# Patient Record
Sex: Female | Born: 1970 | Race: White | Hispanic: No | Marital: Married
Health system: Midwestern US, Academic
[De-identification: ages and names within clinical notes are randomized; demographics above are authoritative.]

---

## 2010-08-08 NOTE — Unmapped (Signed)
Formatting of this note might be different from the original.  Message copied by Trish Fountain on Thu Aug 08, 2010  2:04 PM  ------       Message from: Clelia Schaumann       Created: Thu Aug 08, 2010 10:17 AM       Contact: Brenee Gajda was seen by Dr. Court Joy on July 22, 2010.  Since then she has been experiencing pain in her fingers when writing and typing.  She is wondering if there is any kind of brace she can get to help with this.         Please call her at [phone]         Thanks, Andrey Campanile  Electronically signed by Trish Fountain at 08/08/2010  2:04 PM EDT

## 2010-08-08 NOTE — Unmapped (Signed)
Formatting of this note might be different from the original.  Spoke to [name] about her finger issues. She said its her fingers and they are causing her problems with typing/writing, especially the joints towards the top. She also is getting a shooting pain from the elbow down to her hand occasionally. She complains this feels like nerve pain. She is living in [place], so she could not go anywhere we would recommend in [place]. I suggested contacting some hand therapy centers, or OTs to see if they would be able to give her exercises and possibly fit her for finger splints. She will look into this and let me know if she needs a referral.   Electronically signed by Trish Fountain at 08/08/2010  2:14 PM EDT

## 2018-12-15 IMAGING — MR MRI THORACIC SPINE WITHOUT CONTRAST
4 of 7 series · 10 of 48 positions shown · non-contrast
Comparison: None

MRI THORACIC SPINE WITHOUT CONTRAST, 12/15/2018 [DATE]: 
CLINICAL INDICATION: History of nuclear [place] syndrome, lupus and myasthenia 
gravis. Patient temporarily paralyzed due to cervical spine epidural process 
which lasted for two years. Patient has bilateral upper extremity radiculopathy 
and bilateral lower extremity radiculopathy.
TECHNIQUE: Multiplanar, multiecho position MR images of the thoracic spine were 
performed without intravenous contrast enhancement.

[Series 202: mobiview fused cor · coronal · 10.0mm · 0.88mm/px · 3 of 10 slices shown]
[im 1/10]
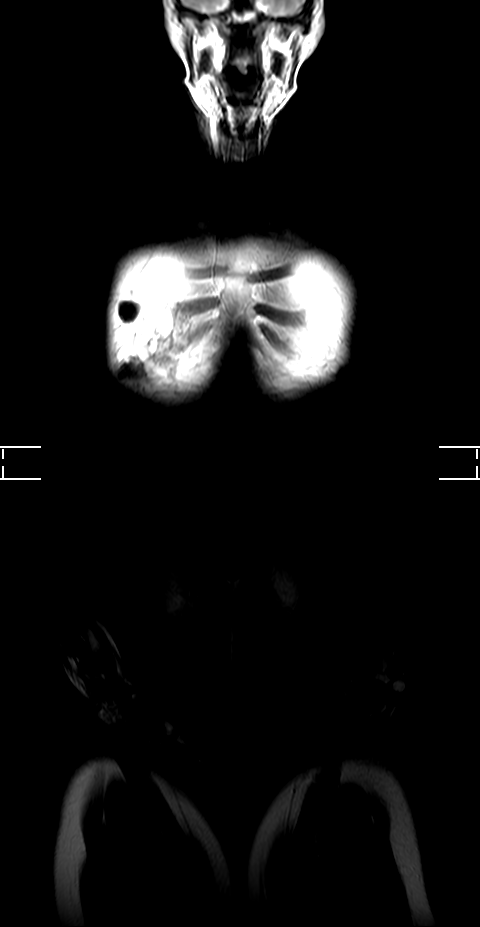
[im 7/10]
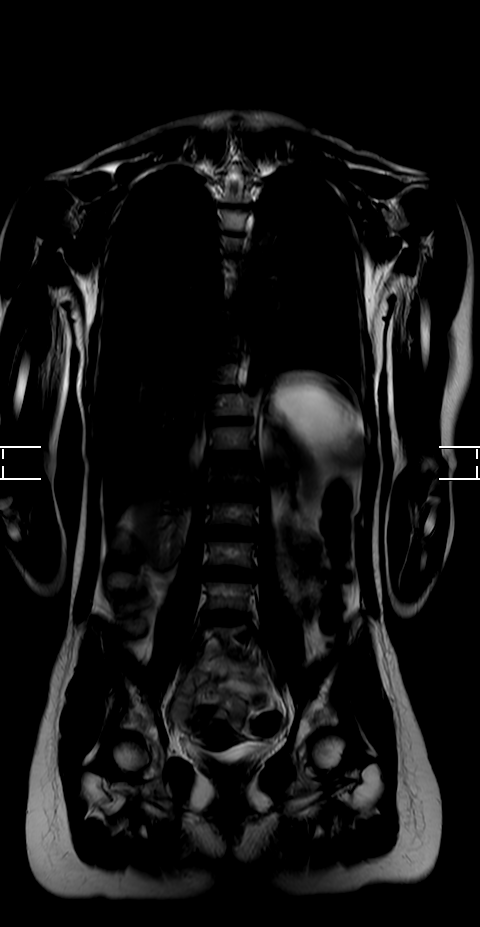
[im 10/10]
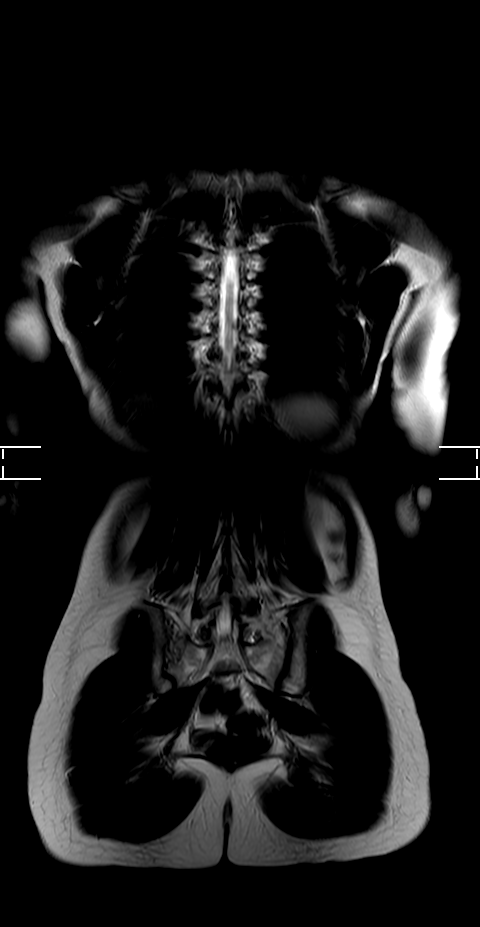

[Series 303: T1 · sagittal · 5.5mm · 0.66mm/px · 3 of 15 slices shown]
[im 1/15]
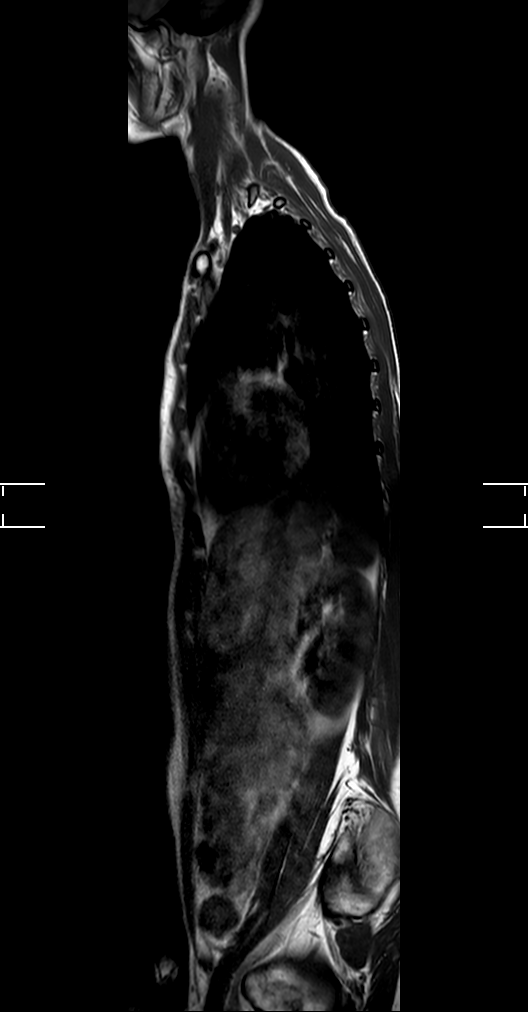
[im 8/15]
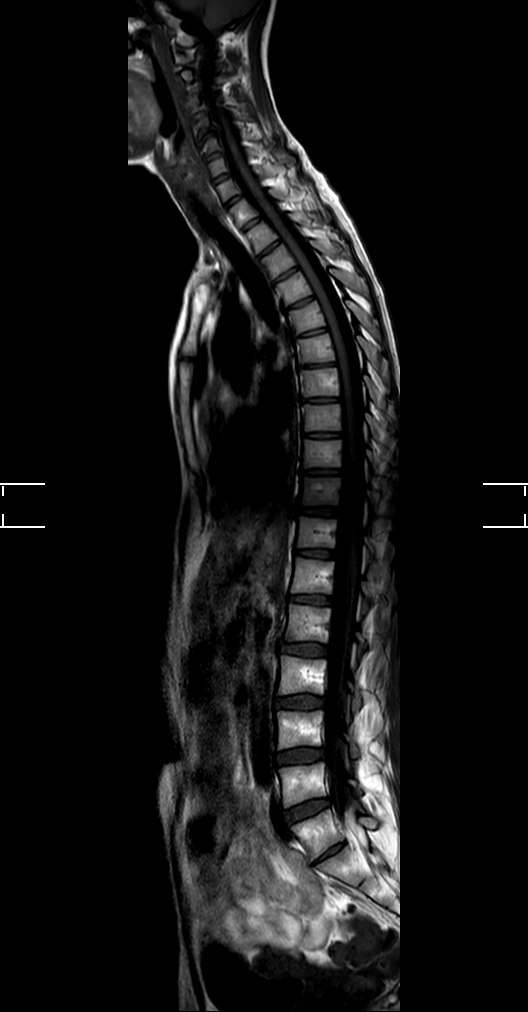
[im 15/15]
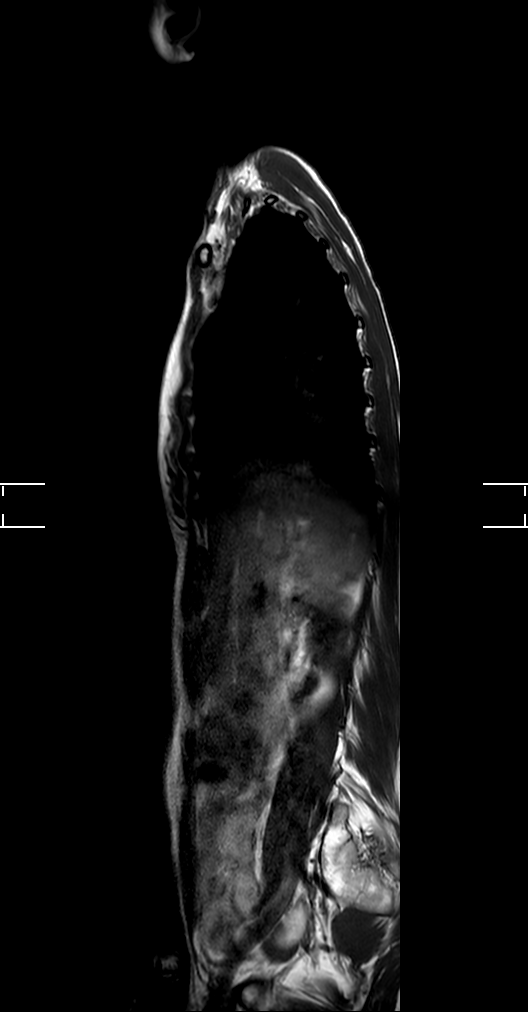

[Series 401: t1_tse_sag · sagittal · 3.0mm · 0.48mm/px · 3 of 21 slices shown]
[im 5/21]
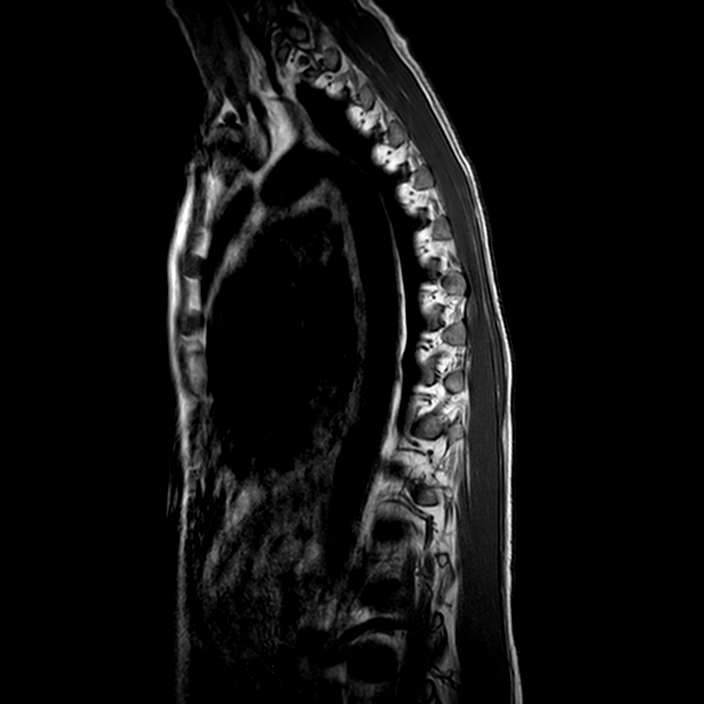
[im 13/21]
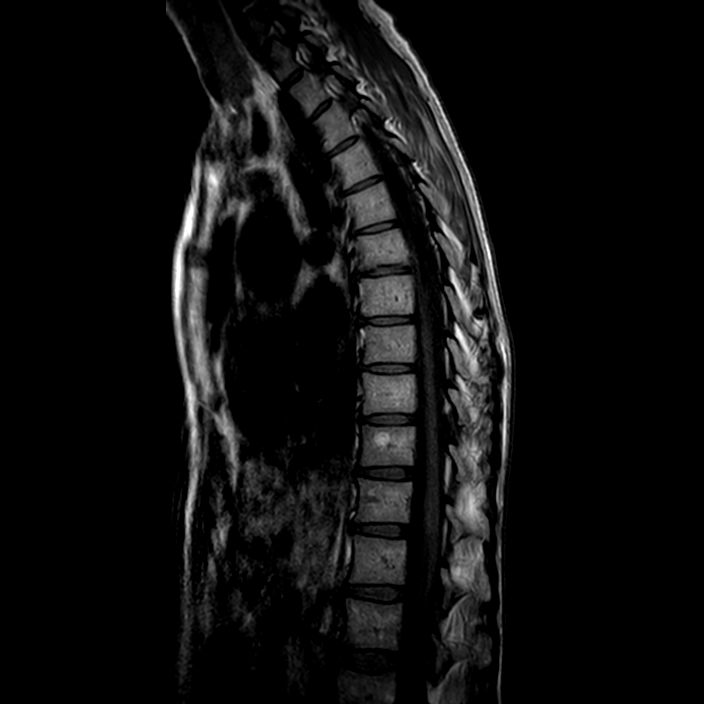
[im 21/21]
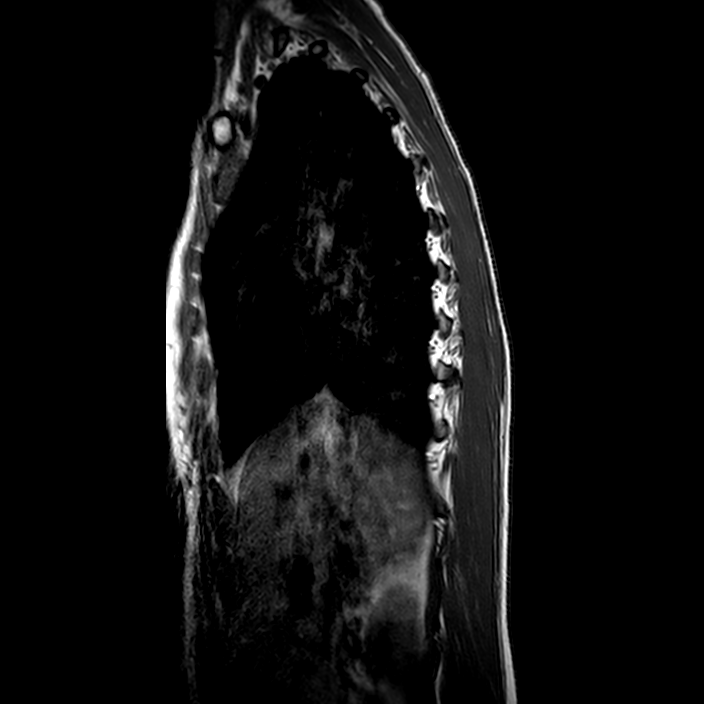

[Series 501: t2_tse_sag · sagittal · 3.0mm · 0.45mm/px · 1 of 21 slices shown]
[im 5/21]
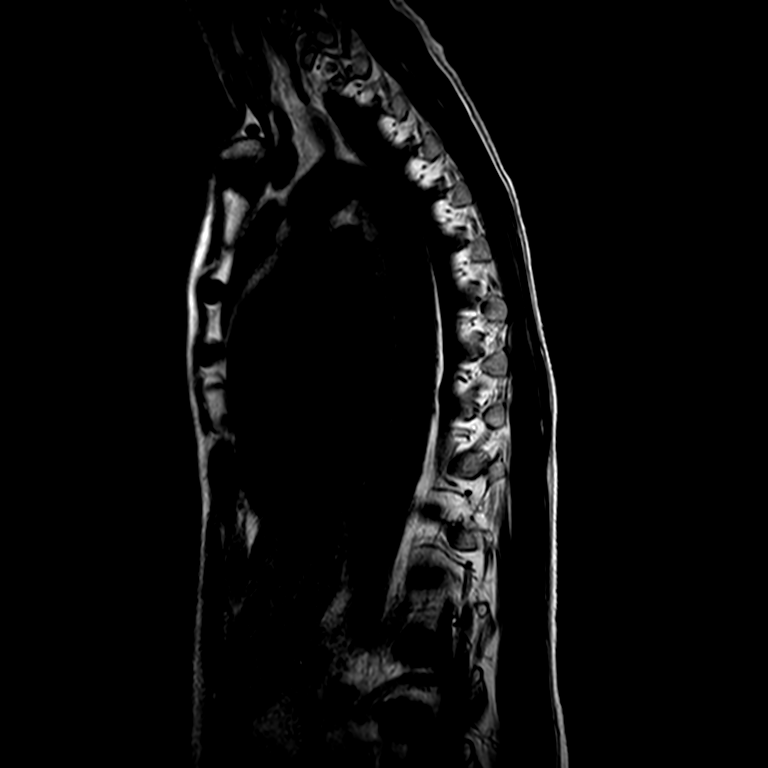

[10 of 48 positions shown; findings below may reference images not displayed]

FINDINGS: There is no fracture or subluxation seen in the thoracic spine. The 
thoracic cord is intact with no intra or extra thecal abnormality seen along the 
spinal canal. No thoracic epidural hematoma or collection is present. 
There is some loss of T2 signal in the upper thoracic spine within this basis 
from T1 through T7 with small Schmorl's nodes seen at the inferior plate of T6. 
There is a small disc right paracentral disc protrusion at the T7-8 level 
causing mild right lateral recess stenosis and density thecal sac on the right, 
but no canal stenosis. This is best seen on sagittal images and not as well seen 
on axial images. 
At T9-10 there are left-sided facet hypertrophic changes indenting the thecal 
sac posteriorly indenting the thecal sac posteriorly and to the left without 
canal stenosis and this abuts the thoracic cord but does not compress the 
thoracic cord or efface surrounding CSF signal. 
No disc protrusions or other canal stenosis or lateral recess stenosis seen 
elsewhere in the thoracic spine. The conus terminates at the L1 level and has 
normal size and signal intensity.
IMPRESSION: At T7-8 there is a small right paracentral disc protrusion causing mild right 
lateral recess stenosis without canal stenosis and indents the thecal sac on the 
right. This is best appreciated on the sagittal images and not as 
well-demonstrated on the axial images due to angulation of axial images at this 
site. 
At T9-10 there are left-sided facet hypertrophic changes indenting the thecal 
sac posteriorly indenting the thecal sac on the left posteriorly but no 
effacement of the thecal sac or canal stenosis. 
No epidural collections or hematomas with normal-appearing thoracic cord with no 
abnormal thoracic cord signal intensity.

## 2018-12-15 IMAGING — MR MRI CERVICAL SPINE WITHOUT CONTRAST
4 of 5 series · 23 of 48 positions shown · IV contrast (gadolinium)
Comparison: None prior of the cervical spine.

MRI CERVICAL SPINE WITHOUT CONTRAST, 12/15/2018 [DATE]: 
CLINICAL INDICATION: History of varicose Rusmel syndrome. Lupus. Myasthenia 
gravis. Patient temporal a paralyzed after C-spine epidural in 3888 which last 
for 2 years. Spine instability. Bilateral upper extremity and lower extremity 
radiculopathy.
TECHNIQUE: Multiplanar, multiecho position MR images of the cervical spine were 
performed without intravenous gadolinium enhancement.

[Series 801: survey* · axial · 10.0mm · 1.56mm/px · z∈[-50,+136]mm · 8 of 15 slices shown]
[im 1/15]
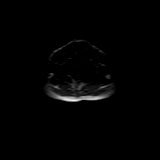
[im 3/15]
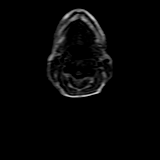
[im 5/15]
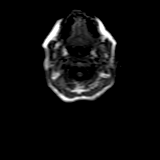
[im 7/15]
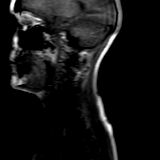
[im 9/15]
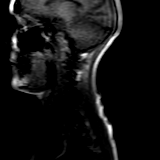
[im 11/15]
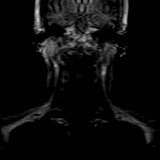
[im 13/15]
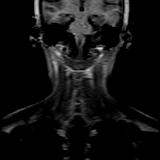
[im 15/15]
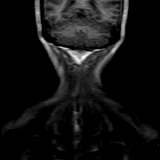

[Series 901: t1_sag · sagittal · 3.0mm · 0.31mm/px · 7 of 15 slices shown]
[im 1/15]
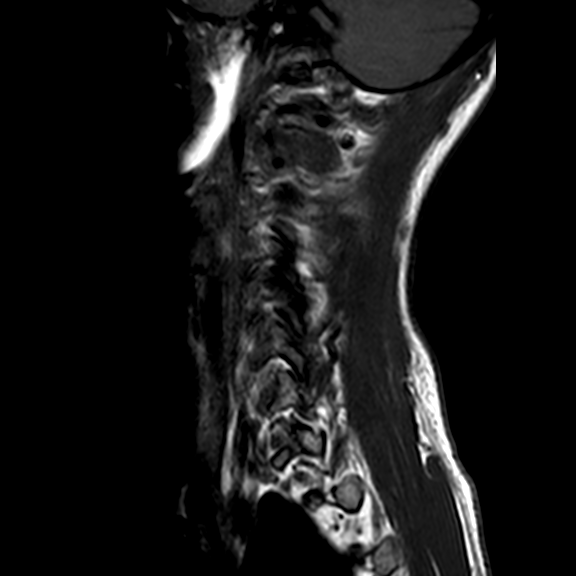
[im 3/15]
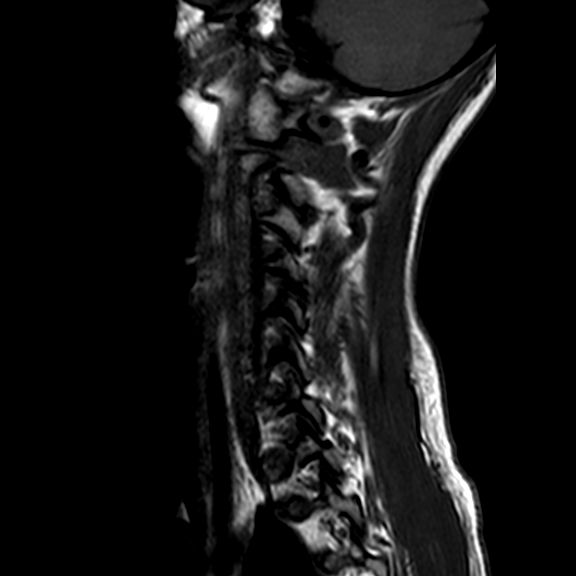
[im 5/15]
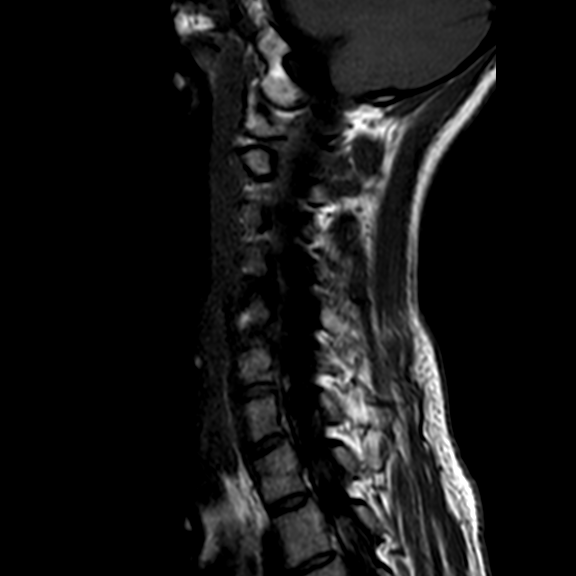
[im 8/15]
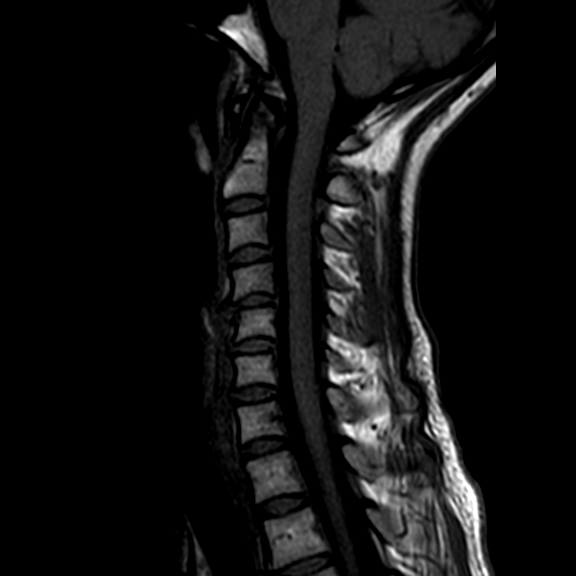
[im 10/15]
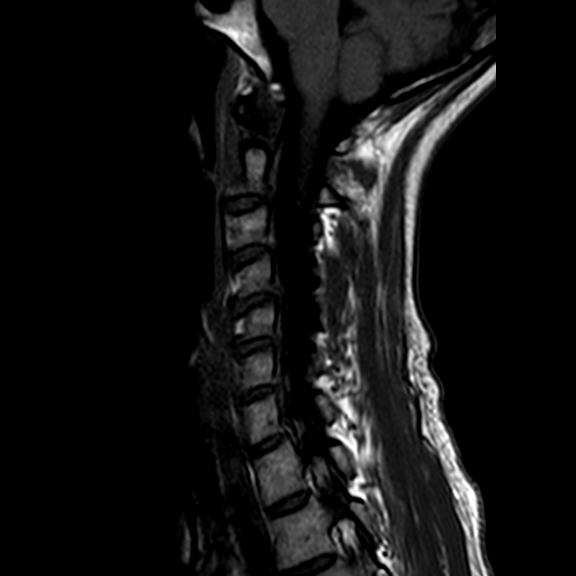
[im 12/15]
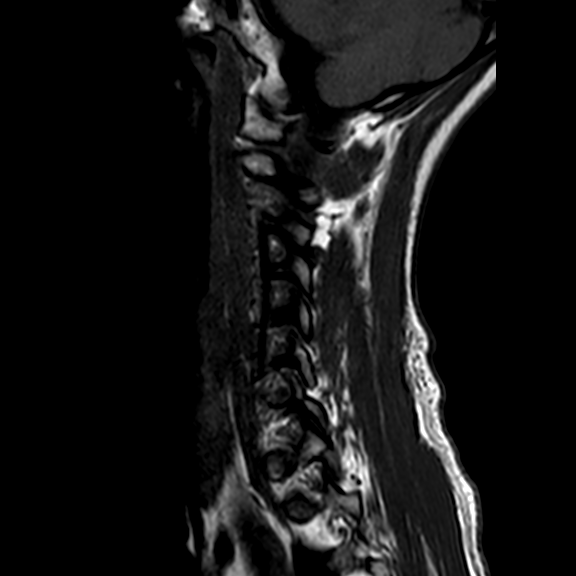
[im 15/15]
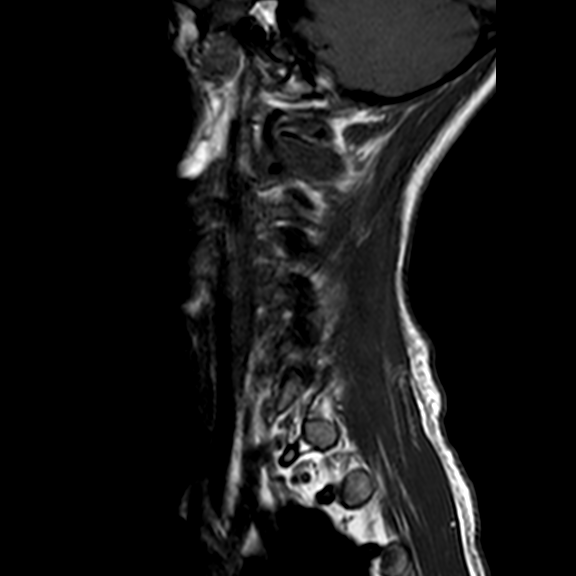

[Series 1001: t2w_mv_xd_sag · sagittal · 3.0mm · 0.31mm/px · 5 of 15 slices shown]
[im 1/15]
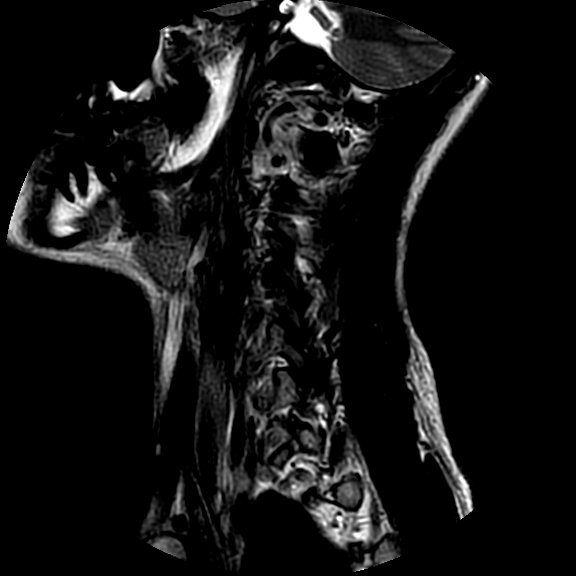
[im 3/15]
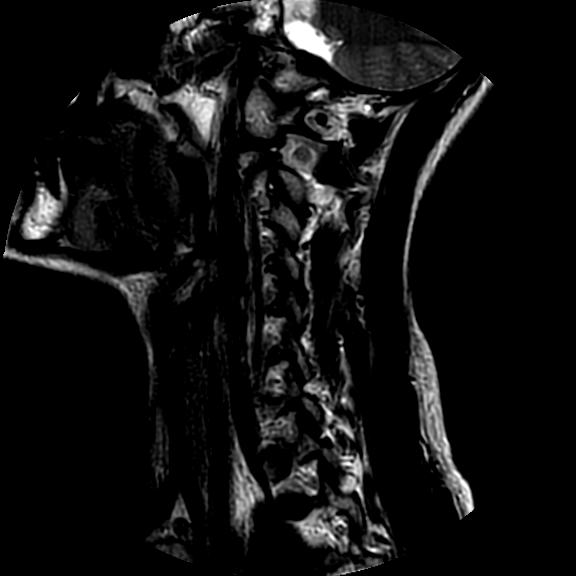
[im 5/15]
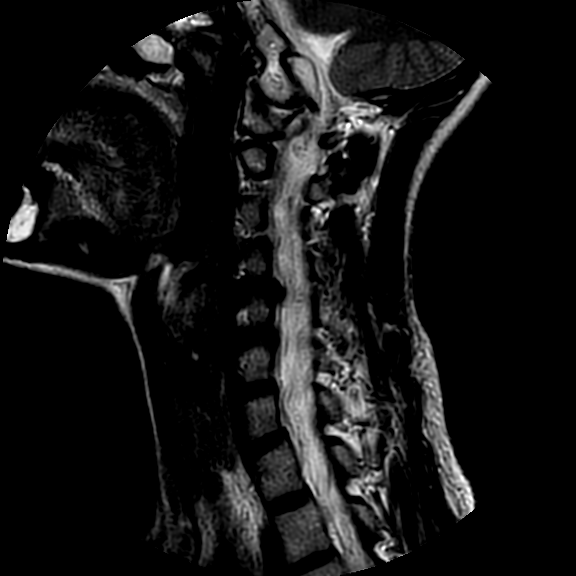
[im 8/15]
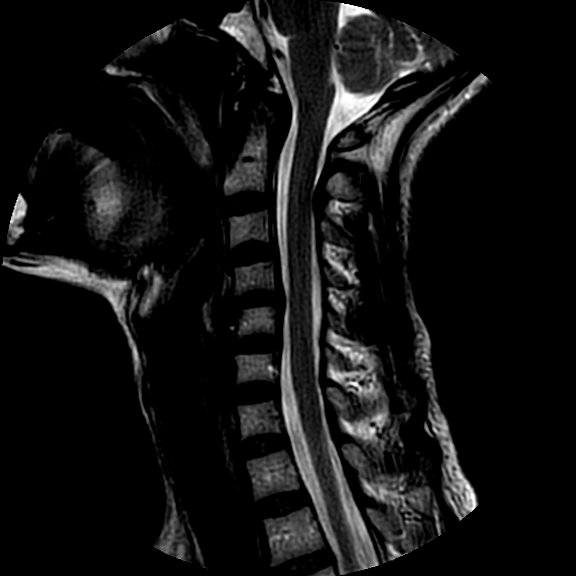
[im 12/15]
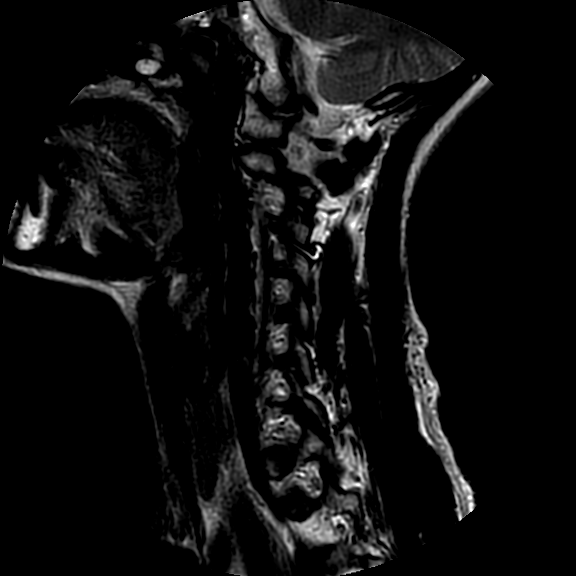

[Series 1101: stir_sag · sagittal · 3.0mm · 0.42mm/px · 3 of 15 slices shown]
[im 3/15]
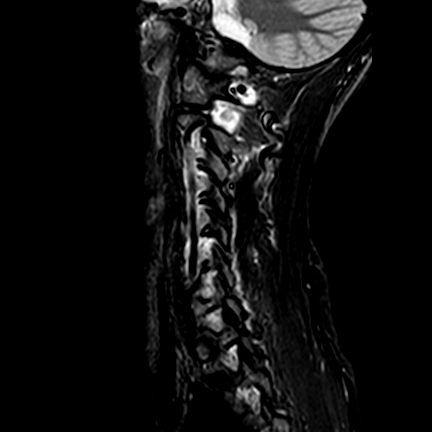
[im 8/15]
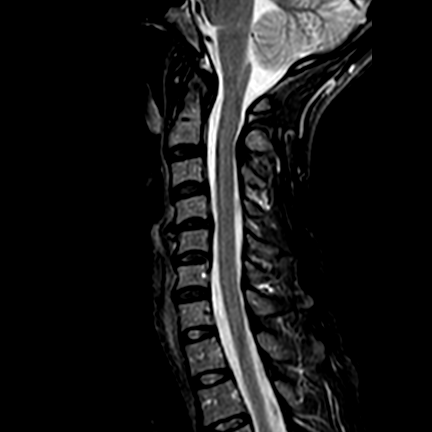
[im 12/15]
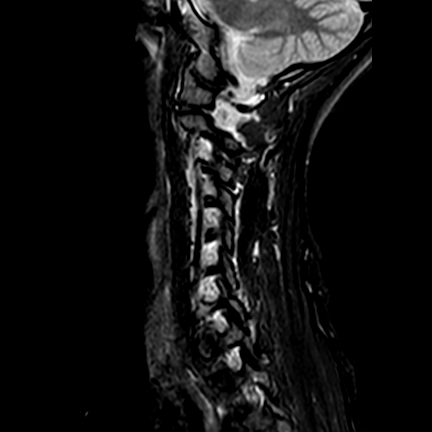

[23 of 48 positions shown; findings below may reference images not displayed]

FINDINGS: The vertebral bodies are normal in height and alignment. No vertebral 
body fracture. No spondylolisthesis. Normal bone marrow signal intensity. No 
signal abnormality or mass within the included portion of the spinal cord or 
spinal canal. The cerebellar tonsils are well located. The foramen magnum is 
negative. Included portions of the intracranial contents are negative. Posterior 
paraspinal soft tissues are negative. 
C2-C3: The disc is normal in height and signal. No disc herniation. Normal 
facets. No spinal canal or neural foraminal stenosis. 
C3-C4: The disc is normal in height and signal. No disc herniation. Normal 
facets. No spinal canal or neural foraminal stenosis. 
C4-C5: Mild disc desiccation and ventral disc height loss. There is ventral 
interbody osteophytic spurring. Mild broad-based disc protrusion with partial 
effacement of ventral thecal sac. No spinal canal or neural foraminal stenosis. 
C5-C6: The disc is normal in height and signal. No disc herniation. Normal 
facets. No spinal canal or neural foraminal stenosis. 
C6-C7: The disc is normal in height and signal. No disc herniation. Normal 
facets. No spinal canal or neural foraminal stenosis. 
C7-T1: The disc is normal in height and signal. No disc herniation. Normal 
facets. No spinal canal or neural foraminal stenosis.
IMPRESSION: 1.  Mild spondylotic changes cervical spine. 
2.  At C4-C5 there is a mild broad-based disc protrusion with partial effacement 
of the ventral thecal sac. 
3.  No spinal canal stenosis, neural foraminal stenosis or discrete neural 
impingement.

## 2020-09-19 NOTE — Unmapped (Signed)
LMTCB TO VERIFY IF PATIENT HAS MRI/CT OR WHAT TESTING DOES THE PATIENT HAS COMPLETED PRIOR TO VISIT.

## 2020-09-21 NOTE — Unmapped (Signed)
Spoke to the patient, she is going to get her pcp to order a MRI Cervical with Flex and Ext and CT and Xray then contact me to get reschedule.

## 2020-09-21 NOTE — Unmapped (Signed)
Pt calling  Pt has had not MRI/CT done in the past year.Pt wants to know if MD can do a video visit for 1st visit. I did tell Pt that MD usually would like to do an inperson visit to evaluate.    Call pt at [phone] (home)

## 2020-09-28 ENCOUNTER — Ambulatory Visit

## 2022-01-29 IMAGING — MR MRI CERVICAL SPINE WITHOUT CONTRAST
7 series · 25 of 48 positions shown · IV contrast (gadolinium)
Comparison: MRI cervical spine from December 15, 2018.

________________________________________________________________________________________________ 
MRI CERVICAL SPINE WITHOUT CONTRAST, 01/29/2022 [DATE]: 
CLINICAL INDICATION: Two-week history of neck pain that radiates to bilateral 
upper extremities.
TECHNIQUE: Multiplanar, multiecho position MR images of the cervical spine were 
performed without intravenous gadolinium enhancement. Patient was scanned on a 
3T magnet.

[Series 101: survey · axial · 10.0mm · 0.94mm/px · 1 of 9 slices shown]
[im 1/9]
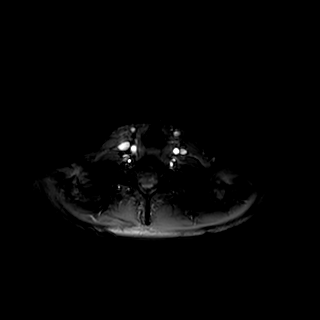

[Series 201: t2w_tse cor · coronal · 5.0mm · 0.52mm/px · 2 of 7 slices shown]
[im 1/7]
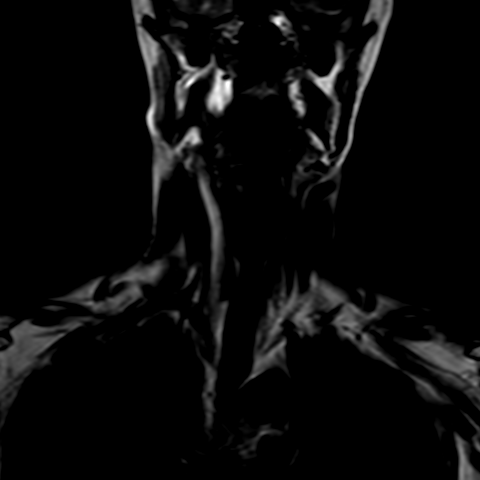
[im 7/7]
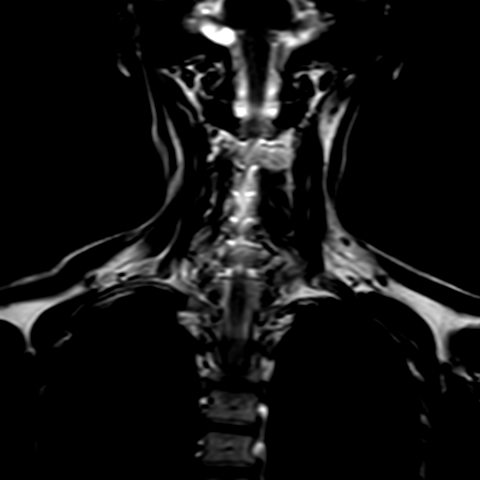

[Series 302: st2w_tse sag fs · sagittal · 3.0mm · 0.32mm/px · 4 of 15 slices shown]
[im 1/15]
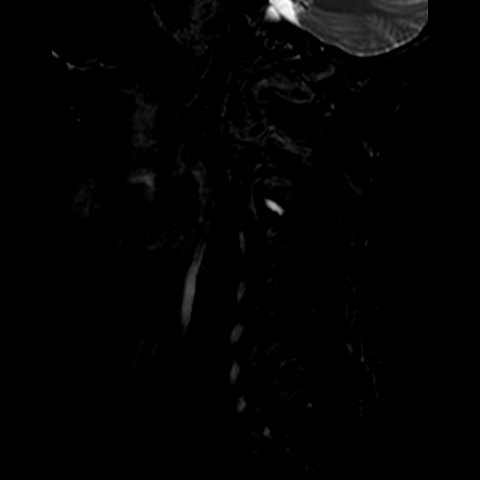
[im 5/15]
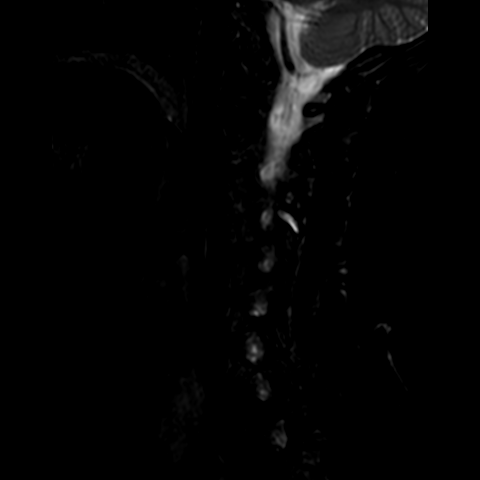
[im 10/15]
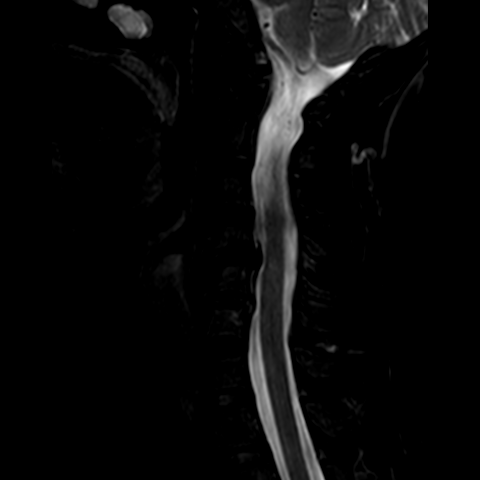
[im 15/15]
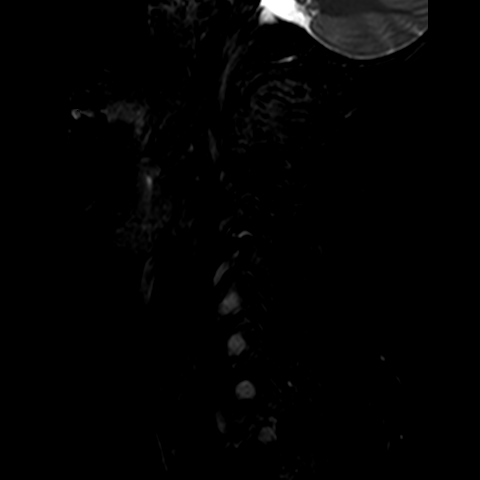

[Series 303: st2w_tse sag · sagittal · 3.0mm · 0.32mm/px · 4 of 15 slices shown]
[im 1/15]
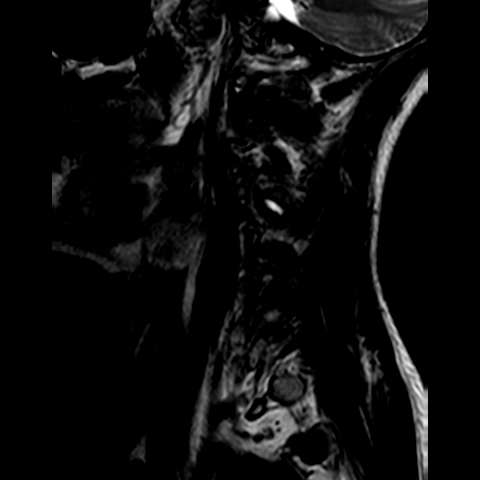
[im 5/15]
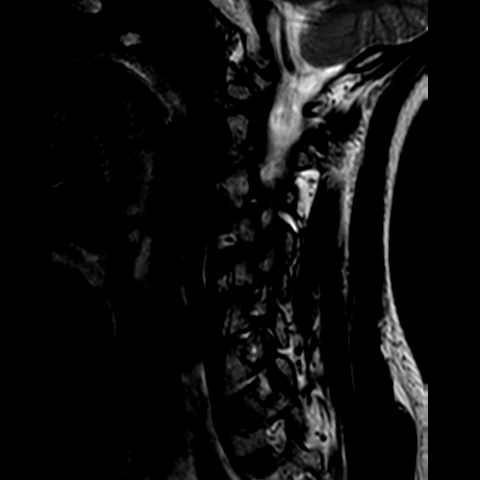
[im 10/15]
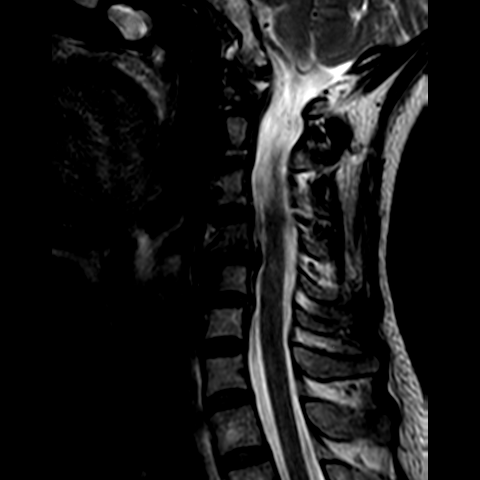
[im 15/15]
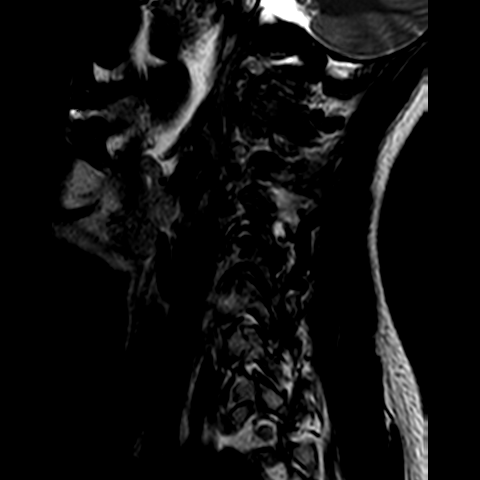

[Series 401: t1w_tse sag · sagittal · 3.0mm · 0.44mm/px · 4 of 15 slices shown]
[im 1/15]
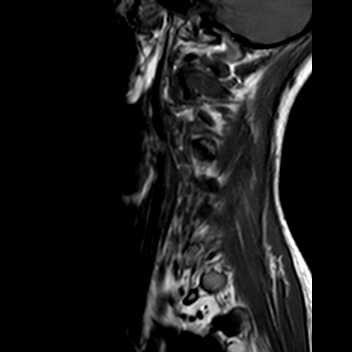
[im 5/15]
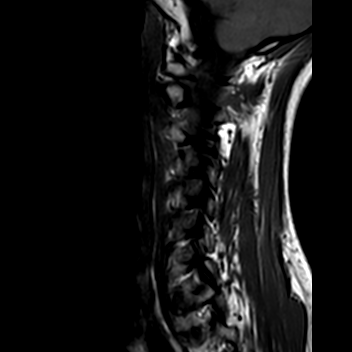
[im 10/15]
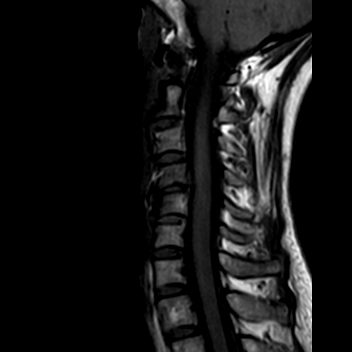
[im 15/15]
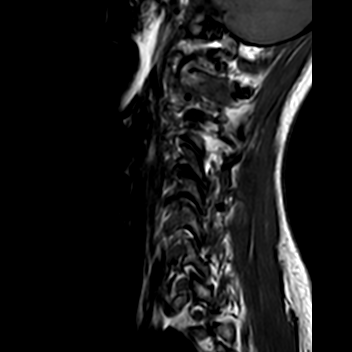

[Series 501: t2w_drive_3d · axial · 3.0mm · 0.33mm/px · z∈[-42,+61]mm · 8 of 80 slices shown]
[im 4/80]
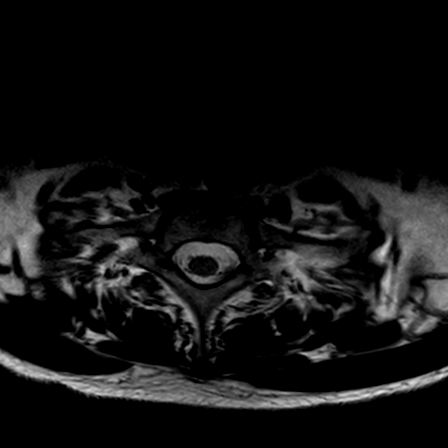
[im 12/80]
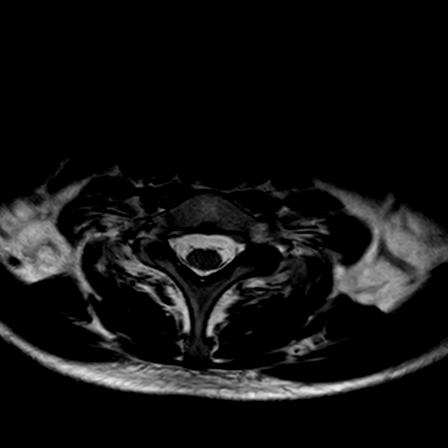
[im 24/80]
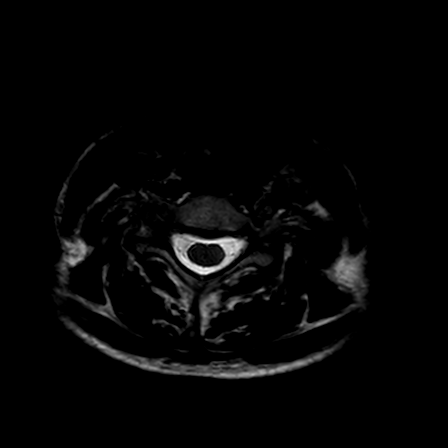
[im 36/80]
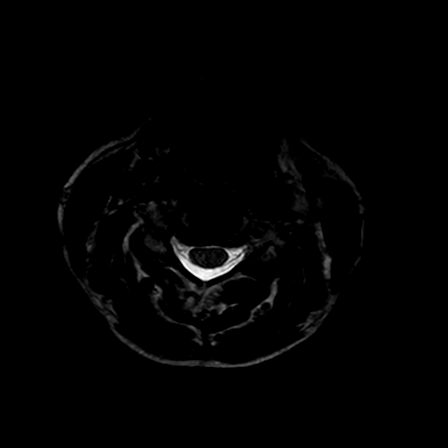
[im 44/80]
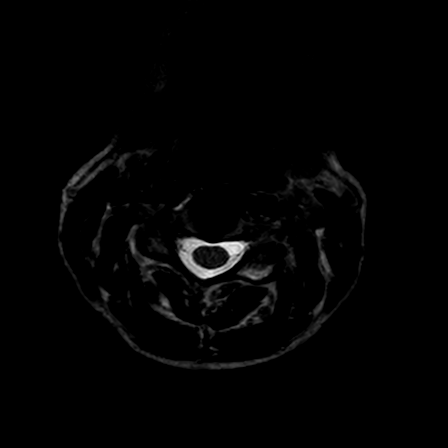
[im 56/80]
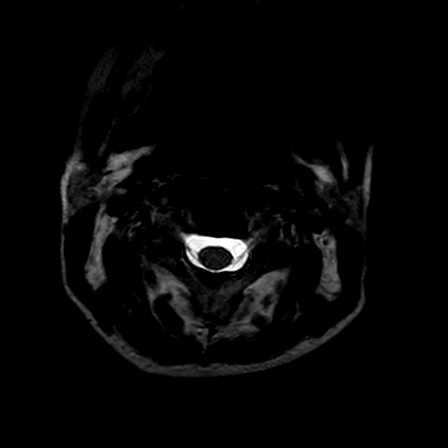
[im 68/80]
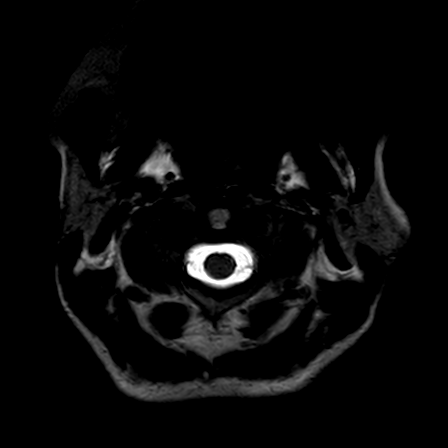
[im 76/80]
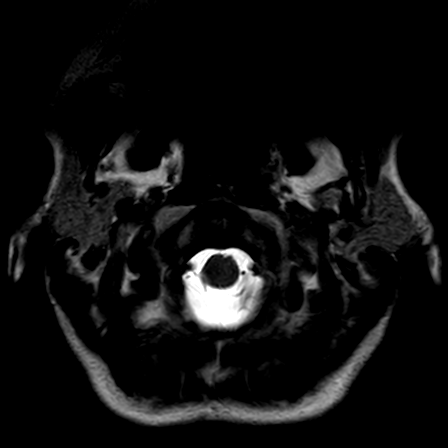

[Series 601: t2w_tse_ax · axial · 3.0mm · 0.32mm/px · z∈[-57,-32]mm · 2 of 45 slices shown]
[im 1/45]
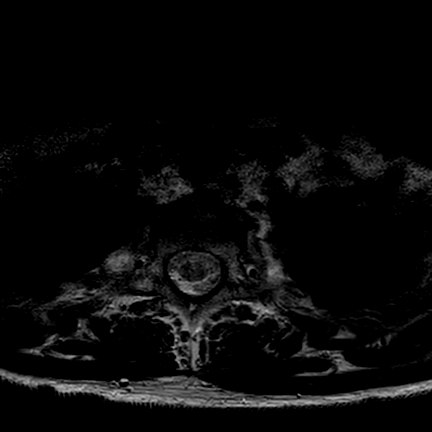
[im 9/45]
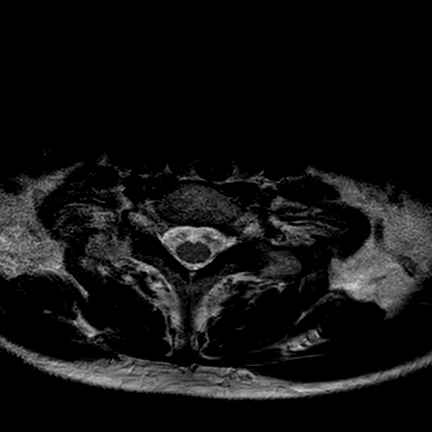

[25 of 48 positions shown; findings below may reference images not displayed]

FINDINGS: -------------------------------------------------------------------------------- 
----------------- 
GENERAL: 
ALIGNMENT: Mild reversal of normal cervical lordosis. Mild retrolisthesis of C4 
on C5 and C5 on C6, new compared to the prior MRI. 
VERTEBRAL BODY HEIGHT: Normal.  
MARROW SIGNAL: No focal suspect signal abnormality. 
CORD SIGNAL: Normal.  
ADDITIONAL FINDINGS: None. 
-------------------------------------------------------------------------------- 
---------------- 
SEGMENTAL: 
CRANIOCERVICAL JUNCTION: No significant stenosis. 
C2-C3: No significant central canal or neural foraminal narrowing. 
C3-C4: Progression of fluid within the left C3-C4 facet joint with mild 
diastases of the facet joint. No significant central canal or neural foraminal 
narrowing.  
C4-C5: Disc osteophyte complex with right uncovertebral joint hypertrophy. Mild 
right sided central canal narrowing. Mild right neural foraminal narrowing. No 
significant left neural foraminal narrowing. 
C5-C6: Disc osteophyte complex with central disc herniation extending 
inferiorly. Very mild central canal narrowing. No significant neural foraminal 
narrowing. Central canal narrowing has progressed. 
C6-C7: No significant central canal or neural foraminal narrowing. 
C7-T1: No significant central canal or neural foraminal narrowing. 
-------------------------------------------------------------------------------- 
---------------
IMPRESSION: 1.  Discogenic/degenerative changes as above. 
2.  No moderate or severe central canal narrowing at any level.  
3.  No moderate or severe neural foraminal narrowing at any level.

## 2022-03-12 NOTE — Unmapped (Signed)
Working on encounter, left voice mail to have patient return my call.

## 2022-03-12 NOTE — Unmapped (Signed)
Patients spouse Nida BoatmanBrad called requesting a NPV with Dr.Virojanapa. [place] in [place]. Please advise

## 2022-03-12 NOTE — Unmapped (Signed)
Patient called to schedule an appointment. She has a referral on file to see Dr.Virojanapa. Please advise

## 2022-03-13 NOTE — Unmapped (Signed)
Patient called the office wanting to speak with the clinical staff regarding questions she had regarding her upcoming appointment.    She can be reached at: [phone]

## 2022-03-13 NOTE — Unmapped (Signed)
Left voice mail to have patient return my call to go over the process we do with our out of patients prior to being scheduled for a appointment.

## 2022-03-13 NOTE — Unmapped (Signed)
Katherine Morris from referring provider office is calling to ask questions regarding the patients appointment. Katherine Morris states you can call her if you don't get the patient [phone].      Patient can be reached at [phone]

## 2022-03-13 NOTE — Unmapped (Signed)
Spoke to the patient prior to scheduling a office visit, patient is going to get all images completed and will mail all documentation and images to me to be review.

## 2022-03-14 NOTE — Unmapped (Signed)
Spoke to Tobi Bastosnna from the referring doctors office, Tobi Bastosnna is going to fax me all documentation and patient is going to mail me all disc to me to be review prior scheduling appointment.

## 2022-04-03 ENCOUNTER — Ambulatory Visit: Payer: MEDICARE

## 2022-04-03 NOTE — Unmapped (Signed)
Tobi Bastos calling   She needs to know if office received all images and when will pt be seen    Tobi Bastos [phone] or Dr Rosalita Chessman cell # [phone]

## 2022-04-04 NOTE — Unmapped (Signed)
Called Anna/ Dr Rosalita Chessman back to inform her I did receive the package with discs and documentation.  I sent Dr Suzette Battiest a message to review and once I hear back from him I will contact Tobi Bastos and the patient back. Verbalized her understanding.

## 2022-04-07 NOTE — Unmapped (Signed)
Spoke to the patient informed her that DR Suzette BattiestVirojanapa stated he don't see anything crazy on their images that shows she might need surgery at this time, but she is welcome to come and tell me her story and Dr Suzette BattiestVirojanapa can work the patient up further. Patient stated she is in the hospital and also is going to have a MRI of Cervical with Flex and Ext completed then mail that disc to me. Patient will contact me after being released from the hospital and inform us how she like to proceed.

## 2022-04-15 NOTE — Unmapped (Signed)
Patient called requesting to speak to Lisa. Please advise.

## 2022-04-16 NOTE — Unmapped (Signed)
Left voice mail to call me back to schedule with Dr Suzette Battiest.

## 2022-04-18 NOTE — Unmapped (Signed)
Spoke to the patient she informed me she is getting MRI of Cervical with Flex and Extension, patient is scheduled for March with Dr Suzette Battiest, patient is bringing in additional images at that time.

## 2022-04-23 ENCOUNTER — Inpatient Hospital Stay: Admit: 2022-04-23

## 2022-05-30 NOTE — Unmapped (Signed)
Left voicemail to have patient return my call to reschedule her appointment for another day due to Dr Rosana Berger will not be in the office on 3/28/20204.

## 2022-06-09 NOTE — Telephone Encounter (Addendum)
Pt returned call to [facility], please try again, thanks!   She offered some dates that would be good for her - 1st choice is 4/16 or 17th or   4/3 or 4th, 4/9 or 10th,

## 2022-06-10 NOTE — Telephone Encounter (Signed)
Left voice mail to have patient rtc.

## 2022-06-10 NOTE — Telephone Encounter (Signed)
Pt called to speak to [facility] regarding appt with Virojanapa. Requesting call back to discuss appt availability on 4/16 or 4/17. Please advise.

## 2022-06-11 NOTE — Telephone Encounter (Signed)
Spoke to the patient verified she received my message regarding her appointment that is scheduled on Thursday, 07/31/2022 @ 9 am. Patient verbalized her understanding.

## 2022-06-11 NOTE — Telephone Encounter (Signed)
PT's doctor upset that PT has yet to be seen. States they have been working on this issue since nov. Would like a call back and for PT to be scheduled today. Please advise.

## 2022-06-11 NOTE — Telephone Encounter (Signed)
Returned patient's provider call informed him I have her scheduled on 07/24/2022 but I tried to contact her to reschedule for another day, patient is scheduled for 07/31/2022 @ 9 am.

## 2022-06-16 NOTE — Telephone Encounter (Signed)
Patient is asking if a disc was received on 06/12/2022    Please advise   [phone]

## 2022-06-17 NOTE — Telephone Encounter (Addendum)
Spoke to the patient informed her we have not received her disc yet, patient is scheduled on 07/31/2022 and will bring her disc with report.

## 2022-06-26 ENCOUNTER — Inpatient Hospital Stay: Admit: 2022-06-26

## 2022-07-24 ENCOUNTER — Ambulatory Visit: Payer: MEDICARE

## 2022-07-31 ENCOUNTER — Ambulatory Visit: Admit: 2022-07-31 | Discharge: 2022-08-04 | Payer: MEDICARE

## 2022-07-31 DIAGNOSIS — M4012 Other secondary kyphosis, cervical region: Secondary | ICD-10-CM
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
## 2022-07-31 NOTE — Patient Instructions (Signed)
An After Visit Summary was printed and given to the patient.      FOLLOW UP AS NEEDED. [phone]
